# Patient Record
Sex: Male | Born: 1980 | Race: Black or African American | Hispanic: No | Marital: Single | Smoking: Current every day smoker
Health system: Southern US, Community
[De-identification: ages and names within clinical notes are randomized; demographics above are authoritative.]

## PROBLEM LIST (undated history)

## (undated) DIAGNOSIS — A64 Unspecified sexually transmitted disease: Secondary | ICD-10-CM

## (undated) HISTORY — PX: CYST REMOVAL HAND: SHX6279

---

## 2009-06-15 ENCOUNTER — Emergency Department (HOSPITAL_COMMUNITY): Admission: EM | Admit: 2009-06-15 | Discharge: 2009-06-15 | Payer: Self-pay | Admitting: Emergency Medicine

## 2013-01-23 ENCOUNTER — Emergency Department (HOSPITAL_BASED_OUTPATIENT_CLINIC_OR_DEPARTMENT_OTHER)
Admission: EM | Admit: 2013-01-23 | Discharge: 2013-01-23 | Disposition: A | Discharge status: Home / Self Care | Payer: Self-pay | Attending: Emergency Medicine | Admitting: Emergency Medicine

## 2013-01-23 ENCOUNTER — Encounter (HOSPITAL_BASED_OUTPATIENT_CLINIC_OR_DEPARTMENT_OTHER): Payer: Self-pay | Admitting: Emergency Medicine

## 2013-01-23 ENCOUNTER — Emergency Department (HOSPITAL_BASED_OUTPATIENT_CLINIC_OR_DEPARTMENT_OTHER): Payer: Self-pay

## 2013-01-23 ENCOUNTER — Other Ambulatory Visit: Payer: Self-pay

## 2013-01-23 DIAGNOSIS — R079 Chest pain, unspecified: Secondary | ICD-10-CM | POA: Insufficient documentation

## 2013-01-23 DIAGNOSIS — F17200 Nicotine dependence, unspecified, uncomplicated: Secondary | ICD-10-CM | POA: Insufficient documentation

## 2013-01-23 LAB — CBC WITH DIFFERENTIAL/PLATELET
Basophils Absolute: 0 10*3/uL (ref 0.0–0.1)
Basophils Relative: 0 % (ref 0–1)
Eosinophils Relative: 3 % (ref 0–5)
HCT: 40.4 % (ref 39.0–52.0)
Hemoglobin: 13.7 g/dL (ref 13.0–17.0)
Lymphocytes Relative: 65 % — ABNORMAL HIGH (ref 12–46)
Lymphs Abs: 5.9 10*3/uL — ABNORMAL HIGH (ref 0.7–4.0)
MCH: 31.9 pg (ref 26.0–34.0)
MCV: 94.2 fL (ref 78.0–100.0)
Monocytes Absolute: 0.5 10*3/uL (ref 0.1–1.0)
Neutro Abs: 2.3 10*3/uL (ref 1.7–7.7)
RBC: 4.29 MIL/uL (ref 4.22–5.81)
RDW: 13.5 % (ref 11.5–15.5)
WBC: 9.1 10*3/uL (ref 4.0–10.5)

## 2013-01-23 LAB — COMPREHENSIVE METABOLIC PANEL
ALT: 23 U/L (ref 0–53)
AST: 34 U/L (ref 0–37)
CO2: 26 mEq/L (ref 19–32)
Calcium: 9.5 mg/dL (ref 8.4–10.5)
Chloride: 103 mEq/L (ref 96–112)
Creatinine, Ser: 1.1 mg/dL (ref 0.50–1.35)
GFR calc Af Amer: >90 mL/min (ref >90)
GFR calc non Af Amer: 88 mL/min — ABNORMAL LOW (ref >90)
Glucose, Bld: 85 mg/dL (ref 70–99)
Total Bilirubin: 0.3 mg/dL (ref 0.3–1.2)

## 2013-01-23 MED ORDER — KETOROLAC TROMETHAMINE 60 MG/2ML IM SOLN: 60.0000 mg | Freq: Once | INTRAMUSCULAR | Status: DC

## 2013-01-23 MED ORDER — KETOROLAC TROMETHAMINE 30 MG/ML IJ SOLN
30.0000 mg | Freq: Once | INTRAMUSCULAR | Status: AC
  Administered 2013-01-23: 30 mg via INTRAVENOUS
  Filled 2013-01-23: qty 1

## 2013-01-23 NOTE — ED Provider Notes (Signed)
CSN: [id number]     Arrival date & time 01/23/13  2142 History   First MD Initiated Contact with Patient 01/23/13 2203    Scribed for Geoffery Lyons, MD, the patient was seen in room MH03/MH03. This chart was scribed by Lewanda Rife, ED scribe. Patient's care was started at 10:07 PM  Chief Complaint  Patient presents with  . Chest Pain   (Consider location/radiation/quality/duration/timing/severity/associated sxs/prior Treatment) The history is provided by the patient.   HPI Comments: Kanav Kazmierczak is a 32 y.o. male who presents to the Emergency Department complaining of constant moderate chest pain onset yesterday morning. Describes pain as sharp and non-radiating. Reports symptoms are exacerbated by laughing, coughing, and moving neck. Denies associated change in physical activity, shortness of breath, and fever. Denies PMHx of the same.  Reports familial medical hx of CAD and MI: Brother 49 years old.  History reviewed. No pertinent past medical history. Past Surgical History  Procedure Laterality Date  . Cyst removal hand     No family history on file. History  Substance Use Topics  . Smoking status: Current Every Day Smoker -- 0.50 packs/day    Types: Cigarettes  . Smokeless tobacco: Not on file  . Alcohol Use: Yes    Review of Systems  Constitutional: Negative for fever and activity change.  Respiratory: Negative for shortness of breath.   Cardiovascular: Positive for chest pain.  Psychiatric/Behavioral: Negative for confusion.  All other systems reviewed and are negative.   A complete 10 system review of systems was obtained and all systems are negative except as noted in the HPI and PMHx.    Allergies  Review of patient's allergies indicates no known allergies.  Home Medications  No current outpatient prescriptions on file. BP 127/82  Pulse 74  Temp(Src) 98.8 F (37.1 C) (Oral)  Resp 16  Ht 5\' 9"  (1.753 m)  Wt 165 lb (74.844 kg)  BMI 24.36 kg/m2  SpO2  100% Physical Exam  Nursing note and vitals reviewed. Constitutional: He is oriented to person, place, and time. He appears well-developed and well-nourished. No distress.  HENT:  Head: Normocephalic and atraumatic.  Eyes: Conjunctivae and EOM are normal.  Neck: Normal range of motion. Neck supple. No tracheal deviation present.  Cardiovascular: Normal rate and regular rhythm.   No murmur heard. Pulmonary/Chest: Effort normal and breath sounds normal. No respiratory distress. He has no wheezes. He has no rales. He exhibits no tenderness.  Abdominal: Soft. There is no tenderness.  Musculoskeletal: Normal range of motion. He exhibits no edema.  Neurological: He is alert and oriented to person, place, and time.  Skin: Skin is warm and dry.  Psychiatric: He has a normal mood and affect. His behavior is normal.    ED Course  Procedures (including critical care time) DIAGNOSTIC STUDIES: Oxygen Saturation is 100% on room air, normal by my interpretation.    COORDINATION OF CARE:  Nursing notes reviewed. Vital signs reviewed. Initial pt interview and examination performed.   10:09 PM-Discussed work up plan with pt at bedside, which includes Troponin, CBC with diff panel, CMP, and CXR. Pt agrees with plan.   Treatment plan initiated: Medications  ketorolac (TORADOL) 30 MG/ML injection 30 mg (not administered)     Initial diagnostic testing ordered.    Labs Review Labs Reviewed  CBC WITH DIFFERENTIAL  COMPREHENSIVE METABOLIC PANEL  TROPONIN I   Imaging Review No results found.  EKG Interpretation   None       Date: 01/23/2013  Rate: 56  Rhythm: sinus bradycardia  QRS Axis: normal  Intervals: normal  ST/T Wave abnormalities: normal  Conduction Disutrbances:nonspecific intraventricular conduction delay  Narrative Interpretation:   Old EKG Reviewed: none available    MDM  No diagnosis found. Patient is a 32 year old male presents with a 36 hour history of sharp  pain in the right side of his chest. He woke up with this 2 mornings ago and has worsened since that time. Is worse with position and breathing but he denies shortness of breath cough or fever. He denies any recent exertional symptoms. There is no diaphoresis, shortness of breath, or radiation to the arm or jaw. He has no prior cardiac history however he states that an older brother recently had an MI.  Workup reveals an essentially unremarkable EKG and negative troponin despite 36 hours of symptoms. His symptoms are atypical and reproducible with palpation of his chest and with movement of his arms and neck. I strongly doubt that this is cardiac in nature feel as though he is stable for discharge. I think this is most likely musculoskeletal in nature and I feel that he is stable for discharge.   I personally performed the services described in this documentation, which was scribed in my presence. The recorded information has been reviewed and is accurate.      Geoffery Lyons, MD 01/23/13 [phone]

## 2013-01-23 NOTE — ED Notes (Signed)
MD at bedside to discuss results of testing. 

## 2013-01-23 NOTE — ED Notes (Signed)
Pt reports woke last night with pain in right side of chest.  Denies additional symptoms.  Reports worse with positional changes of neck.

## 2014-07-08 IMAGING — CR DG CHEST 2V
2 series · 2 of 2 positions shown · non-contrast
Comparison: None.

CLINICAL DATA: Chest pain

CHEST - 2 VIEW

[w chest pa]
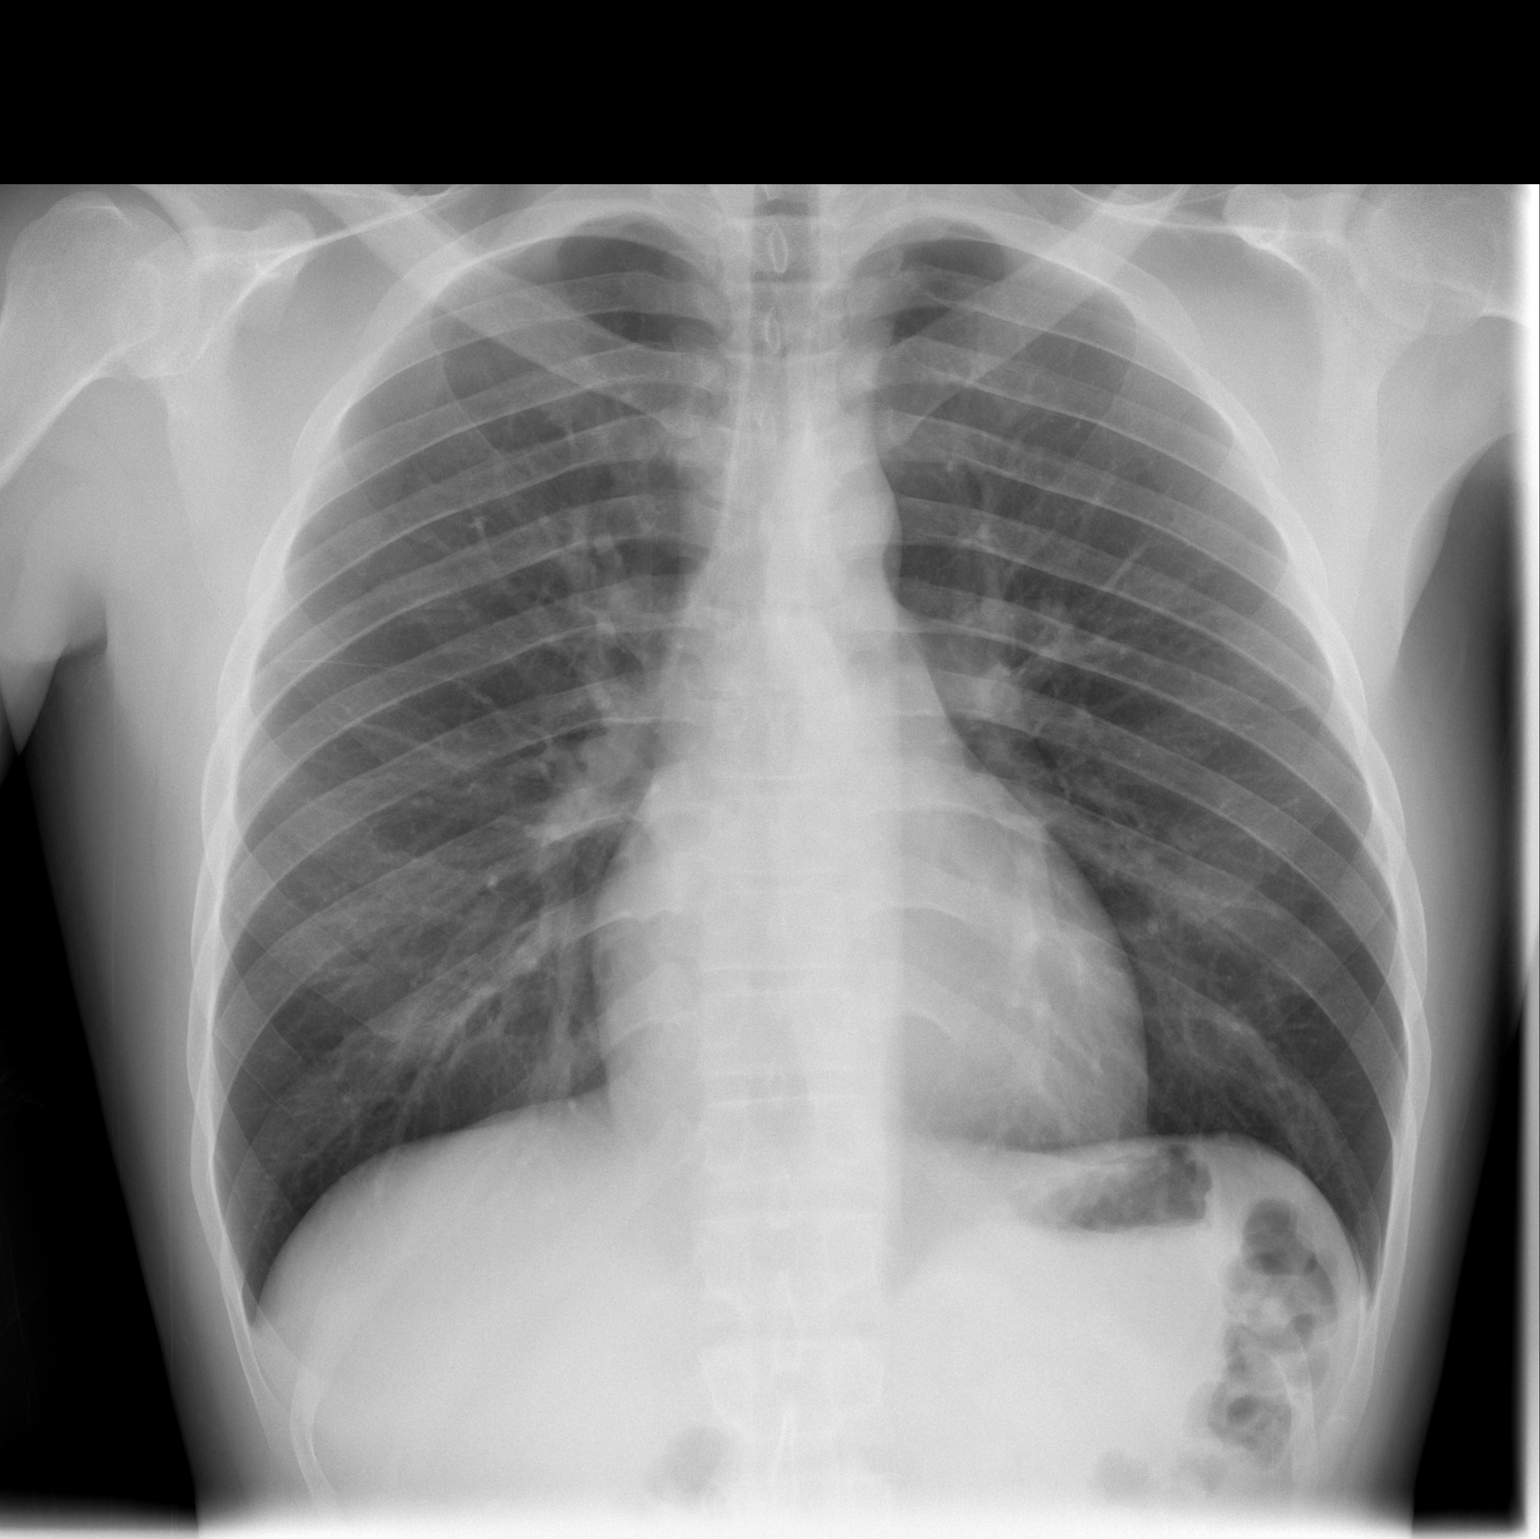

[w chest lat]
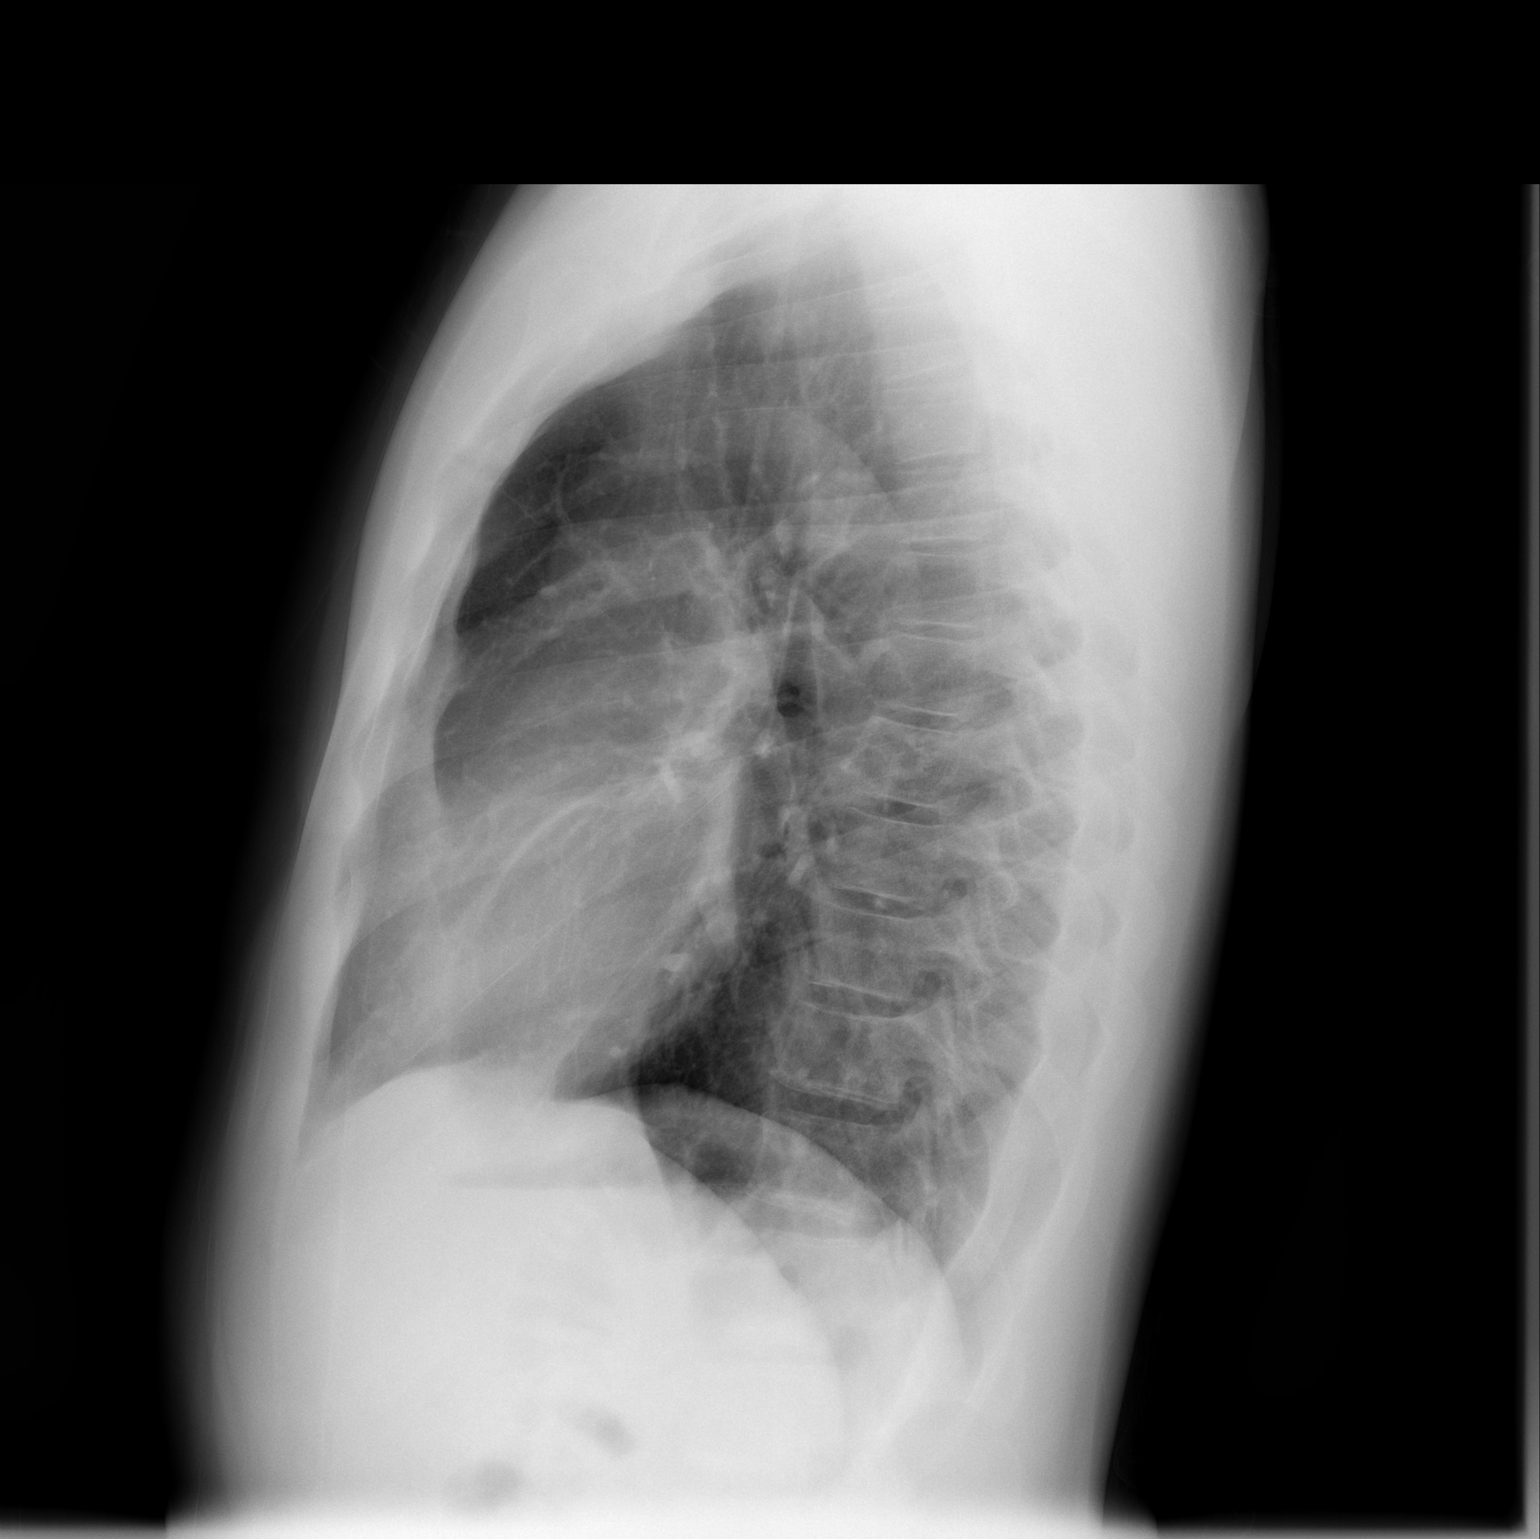

[2 of 2 positions shown; findings below may reference images not displayed]

FINDINGS: Cardiac and mediastinal silhouettes are within normal
limits.

Lungs are well inflated.  No airspace consolidation, effusion, or
pulmonary edema is identified.  There is no pneumothorax.

No acute osseous abnormality.
IMPRESSION: No acute cardiopulmonary process.

## 2017-07-10 ENCOUNTER — Other Ambulatory Visit: Payer: Self-pay

## 2017-07-10 ENCOUNTER — Emergency Department (HOSPITAL_BASED_OUTPATIENT_CLINIC_OR_DEPARTMENT_OTHER)
Admission: EM | Admit: 2017-07-10 | Discharge: 2017-07-10 | Disposition: A | Discharge status: Home / Self Care | Payer: Self-pay | Attending: Emergency Medicine | Admitting: Emergency Medicine

## 2017-07-10 ENCOUNTER — Encounter (HOSPITAL_BASED_OUTPATIENT_CLINIC_OR_DEPARTMENT_OTHER): Payer: Self-pay | Admitting: Emergency Medicine

## 2017-07-10 DIAGNOSIS — H109 Unspecified conjunctivitis: Secondary | ICD-10-CM | POA: Insufficient documentation

## 2017-07-10 DIAGNOSIS — H5711 Ocular pain, right eye: Secondary | ICD-10-CM | POA: Insufficient documentation

## 2017-07-10 DIAGNOSIS — F17210 Nicotine dependence, cigarettes, uncomplicated: Secondary | ICD-10-CM | POA: Insufficient documentation

## 2017-07-10 MED ORDER — FLUORESCEIN SODIUM 1 MG OP STRP
1.0000 | ORAL_STRIP | Freq: Once | OPHTHALMIC | Status: AC
  Administered 2017-07-10: 1 via OPHTHALMIC
  Filled 2017-07-10: qty 1

## 2017-07-10 MED ORDER — ERYTHROMYCIN 5 MG/GM OP OINT: TOPICAL_OINTMENT | OPHTHALMIC | 0 refills | Status: AC

## 2017-07-10 MED ORDER — TETRACAINE HCL 0.5 % OP SOLN
2.0000 [drp] | Freq: Once | OPHTHALMIC | Status: AC
  Administered 2017-07-10: 2 [drp] via OPHTHALMIC
  Filled 2017-07-10: qty 4

## 2017-07-10 MED FILL — ERYTHROMYCIN EYE OINTMENT: 5 | 7 days supply | Qty: 4 | Fill #0

## 2017-07-10 NOTE — ED Provider Notes (Signed)
[facility] EMERGENCY DEPARTMENT Provider Note   CSN: [id number] Arrival date & time: 07/10/17  0941     History   Chief Complaint Chief Complaint  Patient presents with  . Eye Drainage    HPI Devin Mcmillan is a 37 y.o. male.  HPI   Pt is a 37 y/o male with no past medical history who presents to the ED today c/o redness and irritation that began 3 days ago. States pain is burning and rated at 8/10. States he has pain behind the eye which is worse with blinking. Has FB sensation. Has bump to lower eyelid that is also painful. Reports that 2 days ago clear fluid began to drain from the right eye. Reports blurred vision that resolves with blinking. Denies persistent blurred vision, vision changes, or headaches. No fevers, photophobia, headaches. H/o seasonal allergies. No recent cough, congestion, rhinorrhea, sneezing. No recent illnesses. Does not wear contacts. Denies sick contacts with similar sxs.    History reviewed. No pertinent past medical history.  There are no active problems to display for this patient.   Past Surgical History:  Procedure Laterality Date  . CYST REMOVAL HAND         Home Medications    Prior to Admission medications   Medication Sig Start Date End Date Taking? Authorizing Provider  erythromycin ophthalmic ointment Place a 1/2 inch ribbon of ointment into the lower eyelid every 6 hours for 7 days 07/10/17   Kiptyn Rafuse S, PA-C    Family History History reviewed. No pertinent family history.  Social History Social History   Tobacco Use  . Smoking status: Current Every Day Smoker    Packs/day: 0.50    Types: Cigarettes  . Smokeless tobacco: Never Used  Substance Use Topics  . Alcohol use: Yes  . Drug use: No     Allergies   Patient has no known allergies.   Review of Systems Review of Systems  Constitutional: Negative for chills and fever.  HENT: Negative for congestion, ear pain, rhinorrhea and sore throat.     Eyes: Positive for pain, discharge and redness. Negative for photophobia, itching and visual disturbance.  Respiratory: Negative for shortness of breath.   Cardiovascular: Negative for chest pain.  Gastrointestinal: Negative for abdominal pain, nausea and vomiting.  Genitourinary: Negative for dysuria.  Musculoskeletal: Negative for back pain.  Skin: Negative for rash.  Neurological: Negative for dizziness, light-headedness and headaches.  All other systems reviewed and are negative.    Physical Exam Updated Vital Signs BP 121/90 (BP Location: Left Arm)   Pulse 78   Temp 98.4 F (36.9 C) (Oral)   Resp 18   Ht 5\' 9"  (1.753 m)   Wt 74.8 kg (165 lb)   SpO2 98%   BMI 24.37 kg/m   Physical Exam  Constitutional: He is oriented to person, place, and time. He appears well-developed and well-nourished. No distress.  Eyes: Pupils are equal, round, and reactive to light. Conjunctivae and EOM are normal.  No pain with extraocular movements.  No obvious hordeolum or chalazion on exam.  There is some injection to the right conjunctivae.  No subconjunctival hemorrhage.  Fluorescein stain revealed no uptake no evidence of corneal abrasion.  Tonometry within normal limits.  Pressure 19.  No erythema or effusion swelling surrounding the eye.  Photophobia or consensual photophobia.  Neck: Normal range of motion. Neck supple.  Cardiovascular: Normal rate and regular rhythm.  Pulmonary/Chest: Effort normal and breath sounds normal.  Neurological: He  is alert and oriented to person, place, and time. No cranial nerve deficit.  Skin: Skin is warm and dry.     ED Treatments / Results  Labs (all labs ordered are listed, but only abnormal results are displayed) Labs Reviewed - No data to display  EKG None  Radiology No results found.  Procedures Procedures (including critical care time)  Medications Ordered in ED Medications  tetracaine (PONTOCAINE) 0.5 % ophthalmic solution 2 drop (2  drops Right Eye Given 07/10/17 1209)  fluorescein ophthalmic strip 1 strip (1 strip Right Eye Given 07/10/17 1209)     Initial Impression / Assessment and Plan / ED Course  I have reviewed the triage vital signs and the nursing notes.  Pertinent labs & imaging results that were available during my care of the patient were reviewed by me and considered in my medical decision making (see chart for details).    Discussed pt presentation and exam findings with Dr. Deretha EmoryZackowski, who agrees with the plan to discharge the patient without ophthalmology follow-up.  Advised to get erythromycin ointment to prevent secondary bacterial infection.   Final Clinical Impressions(s) / ED Diagnoses   Final diagnoses:  Conjunctivitis of right eye, unspecified conjunctivitis type    Patient presentation consistent with likely viral conjunctivitis.  No purulent discharge, corneal abrasions, entrapment, consensual photophobia, or dendritic staining with fluorescein study.  Presentation non-concerning for iritis, bacterial conjunctivitis, corneal abrasions, or HSV.  Will give erythromycin ointment to prevent secondary bacterial infection.  Personal hygiene and frequent handwashing discussed.  Patient advised to followup in the ED if symptoms persist or worsen in any way including vision change or purulent discharge.  Patient verbalizes understanding and is agreeable with discharge.   ED Discharge Orders        Ordered    erythromycin ophthalmic ointment     07/10/17 [address]1333       Lauriann Milillo S, PA-C 07/10/17 1338    Vanetta MuldersZackowski, Scott, MD 07/11/17 [phone]

## 2017-07-10 NOTE — ED Triage Notes (Signed)
Patient reports right eye drainage and erythema x 2 days. 

## 2017-07-10 NOTE — Discharge Instructions (Signed)
You were given an antibiotic ointment to apply to your eye.  You should apply the ointment into the lower lid every 6 hours for 7 days.  You should follow-up with your primary care doctor within 1 week for reevaluation.  You Were given information on your discharge paperwork to set up primary care if you do not have a primary care doctor.  You should return to the emergency department if you have any fevers, vision changes, worsening pain, eye swelling, persistent symptoms, or any new or worsening symptoms.

## 2017-12-20 ENCOUNTER — Encounter (HOSPITAL_COMMUNITY): Payer: Self-pay | Admitting: Emergency Medicine

## 2017-12-20 ENCOUNTER — Emergency Department (HOSPITAL_COMMUNITY): Payer: Self-pay

## 2017-12-20 ENCOUNTER — Emergency Department (HOSPITAL_COMMUNITY)
Admission: EM | Admit: 2017-12-20 | Discharge: 2017-12-20 | Disposition: A | Discharge status: Home / Self Care | Payer: Self-pay | Attending: Emergency Medicine | Admitting: Emergency Medicine

## 2017-12-20 DIAGNOSIS — F17210 Nicotine dependence, cigarettes, uncomplicated: Secondary | ICD-10-CM | POA: Insufficient documentation

## 2017-12-20 DIAGNOSIS — R072 Precordial pain: Secondary | ICD-10-CM | POA: Insufficient documentation

## 2017-12-20 LAB — I-STAT CHEM 8, ED
BUN: 9 mg/dL (ref 6–20)
CREATININE: 1.3 mg/dL — AB (ref 0.61–1.24)
Calcium, Ion: 1.11 mmol/L — ABNORMAL LOW (ref 1.15–1.40)
Chloride: 104 mmol/L (ref 98–111)
Glucose, Bld: 175 mg/dL — ABNORMAL HIGH (ref 70–99)
HCT: 49 % (ref 39.0–52.0)
Hemoglobin: 16.7 g/dL (ref 13.0–17.0)
Potassium: 3.9 mmol/L (ref 3.5–5.1)
Sodium: 138 mmol/L (ref 135–145)
TCO2: 23 mmol/L (ref 22–32)

## 2017-12-20 LAB — I-STAT TROPONIN, ED
TROPONIN I, POC: 0 ng/mL (ref 0.00–0.08)
Troponin i, poc: 0 ng/mL (ref 0.00–0.08)

## 2017-12-20 LAB — CBC WITH DIFFERENTIAL/PLATELET
Abs Immature Granulocytes: 0 10*3/uL (ref 0.0–0.1)
Basophils Absolute: 0.1 10*3/uL (ref 0.0–0.1)
Basophils Relative: 1 %
Eosinophils Absolute: 0.1 10*3/uL (ref 0.0–0.7)
Eosinophils Relative: 1 %
HEMATOCRIT: 47.4 % (ref 39.0–52.0)
Hemoglobin: 15.7 g/dL (ref 13.0–17.0)
IMMATURE GRANULOCYTES: 0 %
Lymphocytes Relative: 51 %
Lymphs Abs: 5 10*3/uL — ABNORMAL HIGH (ref 0.7–4.0)
MCH: 31.4 pg (ref 26.0–34.0)
MCHC: 33.1 g/dL (ref 30.0–36.0)
MCV: 94.8 fL (ref 78.0–100.0)
Monocytes Absolute: 0.4 10*3/uL (ref 0.1–1.0)
Monocytes Relative: 4 %
NEUTROS PCT: 43 %
Neutro Abs: 4.2 10*3/uL (ref 1.7–7.7)
Platelets: 263 10*3/uL (ref 150–400)
RBC: 5 MIL/uL (ref 4.22–5.81)
RDW: 13.9 % (ref 11.5–15.5)
WBC: 9.8 10*3/uL (ref 4.0–10.5)

## 2017-12-20 MED ORDER — NALOXONE HCL 2 MG/2ML IJ SOSY
PREFILLED_SYRINGE | INTRAMUSCULAR | Status: AC
  Filled 2017-12-20: qty 2

## 2017-12-20 MED ORDER — NALOXONE HCL 2 MG/2ML IJ SOSY
1.0000 mg | PREFILLED_SYRINGE | Freq: Once | INTRAMUSCULAR | Status: AC
  Administered 2017-12-20: 1 mg via INTRAVENOUS

## 2017-12-20 NOTE — ED Notes (Signed)
  Patient is refusing to give urine specimen and is being verbally abusive to staff members.

## 2017-12-20 NOTE — ED Notes (Signed)
   Patient is having difficulty staying awake and talking with EDP and RN.  Patient was sternal rubbed by EDP with minimal to no response.  Patient was given 1mg  narcan IV.  Patient is slurring his speech and seems under the influence of some kind of drug or medication.

## 2017-12-20 NOTE — ED Provider Notes (Signed)
[facility] EMERGENCY DEPARTMENT Provider Note   CSN: [id number] Arrival date & time: 12/20/17  0259     History   Chief Complaint Chief Complaint  Patient presents with  . Chest Pain    HPI Devin Mcmillan is a 37 y.o. male.  The history is provided by a relative and the patient. History limited by: originally to sleepy then uncooperative and will not give a detailed history despite multiple attempts.  Chest Pain   This is a new problem. The current episode started more than 2 days ago. The problem occurs constantly. The problem has not changed since onset.The pain is present in the lateral region. The pain is severe. Quality: achy. The pain does not radiate. Duration of episode(s) is 3 days. Pertinent negatives include no abdominal pain, no fever, no leg pain, no lower extremity edema, no palpitations, no shortness of breath and no weakness. He has tried nothing for the symptoms. The treatment provided no relief. Risk factors include male gender.  Pertinent negatives for past medical history include no Marfan's syndrome and no MI.  Pertinent negatives for family medical history include: no Marfan's syndrome.  Procedure history is negative for cardiac catheterization.  No leg pain no surgeries no long car trips or plane trips.  No steroids, no exertional symptoms.    History reviewed. No pertinent past medical history.  There are no active problems to display for this patient.   Past Surgical History:  Procedure Laterality Date  . CYST REMOVAL HAND          Home Medications    Prior to Admission medications   Medication Sig Start Date End Date Taking? Authorizing Provider  erythromycin ophthalmic ointment Place a 1/2 inch ribbon of ointment into the lower eyelid every 6 hours for 7 days 07/10/17   Couture, Cortni S, PA-C    Family History No family history on file.  Social History Social History   Tobacco Use  . Smoking status: Current Every Day  Smoker    Packs/day: 0.50    Types: Cigarettes  . Smokeless tobacco: Never Used  Substance Use Topics  . Alcohol use: Yes  . Drug use: No     Allergies   Patient has no known allergies.   Review of Systems Review of Systems  Unable to perform ROS: Other (first sleepy or under the influence then refusing and uncooperative )  Constitutional: Negative for fever.  Respiratory: Negative for chest tightness and shortness of breath.   Cardiovascular: Positive for chest pain. Negative for palpitations and leg swelling.  Gastrointestinal: Negative for abdominal pain.  Neurological: Negative for weakness.     Physical Exam Updated Vital Signs BP 99/64   Pulse (!) 58   Temp 98.6 F (37 C) (Oral)   Resp 13   Ht 5\' 9"  (1.753 m)   Wt 74.8 kg   SpO2 97%   BMI 24.37 kg/m   Physical Exam  Constitutional: He appears well-developed and well-nourished. No distress.  Sleeping will not wake to sternal rub, very sleeping   HENT:  Head: Normocephalic and atraumatic.  Right Ear: External ear normal.  Left Ear: External ear normal.  Eyes: Conjunctivae are normal.  Pinpoint pupils  Neck: Normal range of motion. Neck supple.  Cardiovascular: Normal rate, regular rhythm, normal heart sounds and intact distal pulses.  Pulmonary/Chest: Effort normal and breath sounds normal. No stridor. No respiratory distress. He has no wheezes. He has no rales.  Abdominal: Soft. Bowel sounds are normal.  He exhibits no mass. There is no tenderness. There is no rebound and no guarding.  Musculoskeletal: Normal range of motion. He exhibits no tenderness or deformity.  Neurological: He displays normal reflexes.  Skin: Skin is warm and dry. Capillary refill takes less than 2 seconds.  Psychiatric: He has a normal mood and affect.  Nursing note and vitals reviewed.    ED Treatments / Results  Labs (all labs ordered are listed, but only abnormal results are displayed) Results for orders placed or performed  during the hospital encounter of 12/20/17  CBC with Differential/Platelet  Result Value Ref Range   WBC 9.8 4.0 - 10.5 K/uL   RBC 5.00 4.22 - 5.81 MIL/uL   Hemoglobin 15.7 13.0 - 17.0 g/dL   HCT 40.9 81.1 - 91.4 %   MCV 94.8 78.0 - 100.0 fL   MCH 31.4 26.0 - 34.0 pg   MCHC 33.1 30.0 - 36.0 g/dL   RDW 78.2 95.6 - 21.3 %   Platelets 263 150 - 400 K/uL   Neutrophils Relative % 43 %   Neutro Abs 4.2 1.7 - 7.7 K/uL   Lymphocytes Relative 51 %   Lymphs Abs 5.0 (H) 0.7 - 4.0 K/uL   Monocytes Relative 4 %   Monocytes Absolute 0.4 0.1 - 1.0 K/uL   Eosinophils Relative 1 %   Eosinophils Absolute 0.1 0.0 - 0.7 K/uL   Basophils Relative 1 %   Basophils Absolute 0.1 0.0 - 0.1 K/uL   Immature Granulocytes 0 %   Abs Immature Granulocytes 0.0 0.0 - 0.1 K/uL  I-Stat Chem 8, ED  Result Value Ref Range   Sodium 138 135 - 145 mmol/L   Potassium 3.9 3.5 - 5.1 mmol/L   Chloride 104 98 - 111 mmol/L   BUN 9 6 - 20 mg/dL   Creatinine, Ser 0.86 (H) 0.61 - 1.24 mg/dL   Glucose, Bld 578 (H) 70 - 99 mg/dL   Calcium, Ion 4.69 (L) 1.15 - 1.40 mmol/L   TCO2 23 22 - 32 mmol/L   Hemoglobin 16.7 13.0 - 17.0 g/dL   HCT 62.9 52.8 - 41.3 %  I-stat troponin, ED  Result Value Ref Range   Troponin i, poc 0.00 0.00 - 0.08 ng/mL   Comment 3           No results found.  EKG EKG Interpretation  Date/Time:  Friday December 20 2017 03:07:24 EDT Ventricular Rate:  65 PR Interval:  152 QRS Duration: 114 QT Interval:  404 QTC Calculation: 420 R Axis:   78 Text Interpretation:  Normal sinus rhythm with sinus arrhythmia Confirmed by Nicanor Alcon, Emelee Rodocker ([id number]) on 12/20/2017 3:10:31 AM   Radiology No results found.  Procedures Procedures (including critical care time)  Medications Ordered in ED Medications  naloxone (NARCAN) 2 MG/2ML injection (has no administration in time range)  naloxone [facility]) injection 1 mg (1 mg Intravenous Given 12/20/17 0409)     Awoke with narcan, still will not give a detailed  history to EDP despite multiple polite attempts with nurses and family present.   Final Clinical Impressions(s) / ED Diagnoses   HEARt score is 1 very low risk for MACE.  Follow up with your PMD.  Patient would not give urine for UDS.    Return for fevers >100.4 unrelieved by medication, shortness of breath, intractable vomiting, or diarrhea, Inability to tolerate liquids or food, cough, altered mental status or any concerns. No signs of systemic illness or infection. The patient is nontoxic-appearing on exam  and vital signs are within normal limits.   I have reviewed the triage vital signs and the nursing notes. Pertinent labs &imaging results that were available during my care of the patient were reviewed by me and considered in my medical decision making (see chart for details).  After history, exam, and medical workup I feel the patient has been appropriately medically screened and is safe for discharge home. Pertinent diagnoses were discussed with the patient. Patient was given return precautions.     Rosalina Dingwall, MD 12/20/17 [phone]

## 2017-12-20 NOTE — ED Notes (Signed)
Pt made aware that we need a urine sample, but unable to go at this time. 

## 2017-12-20 NOTE — ED Triage Notes (Signed)
Pt reports onset of CP within past hr. Pt states it started on his R chest and moved across to L side with radiation into L neck. Pt appears high... States he smoked weed earlier today.

## 2019-06-04 IMAGING — DX DG CHEST 2V
2 series · 2 of 2 positions shown · non-contrast
Comparison: 01/23/2013

CLINICAL DATA: Right chest pain radiating to the left side and left
neck.

EXAM:
CHEST - 2 VIEW

[chest pa]
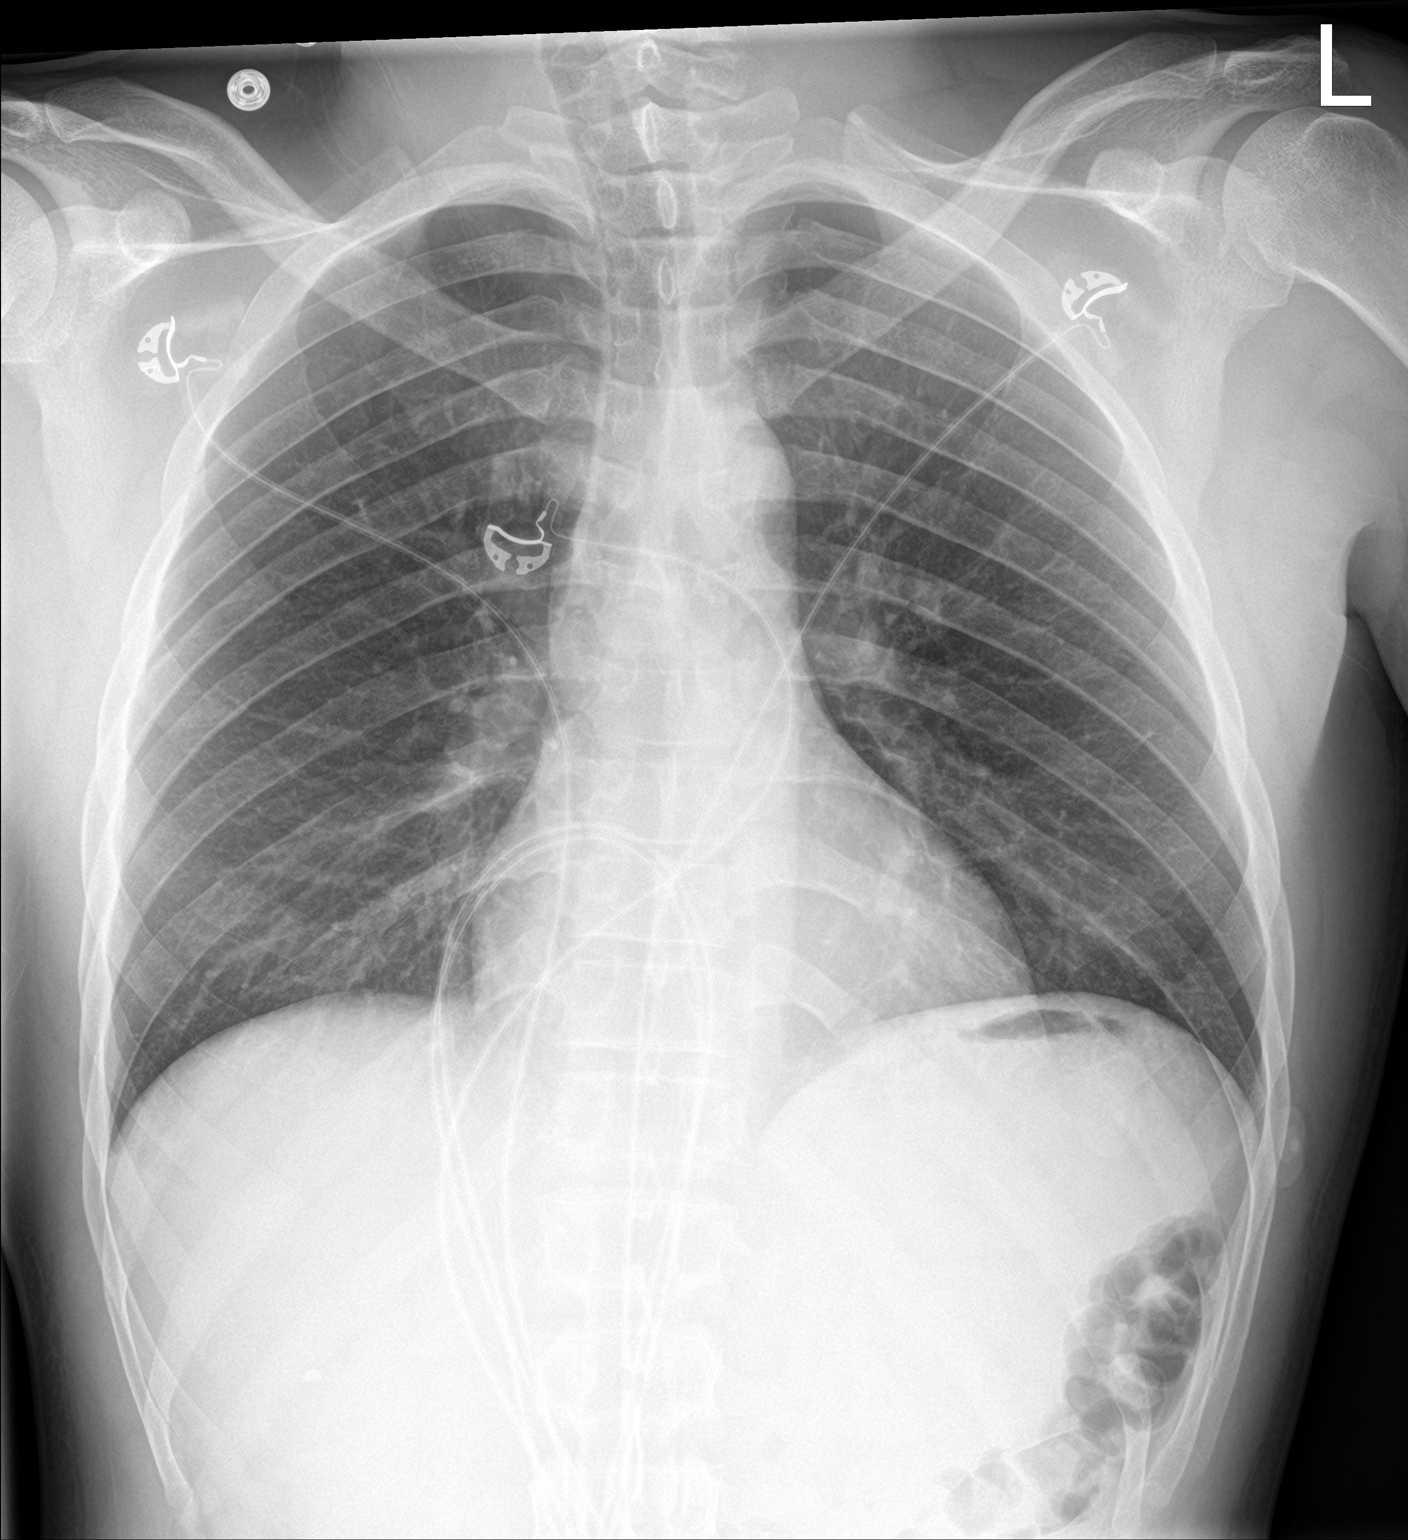

[chest lat]
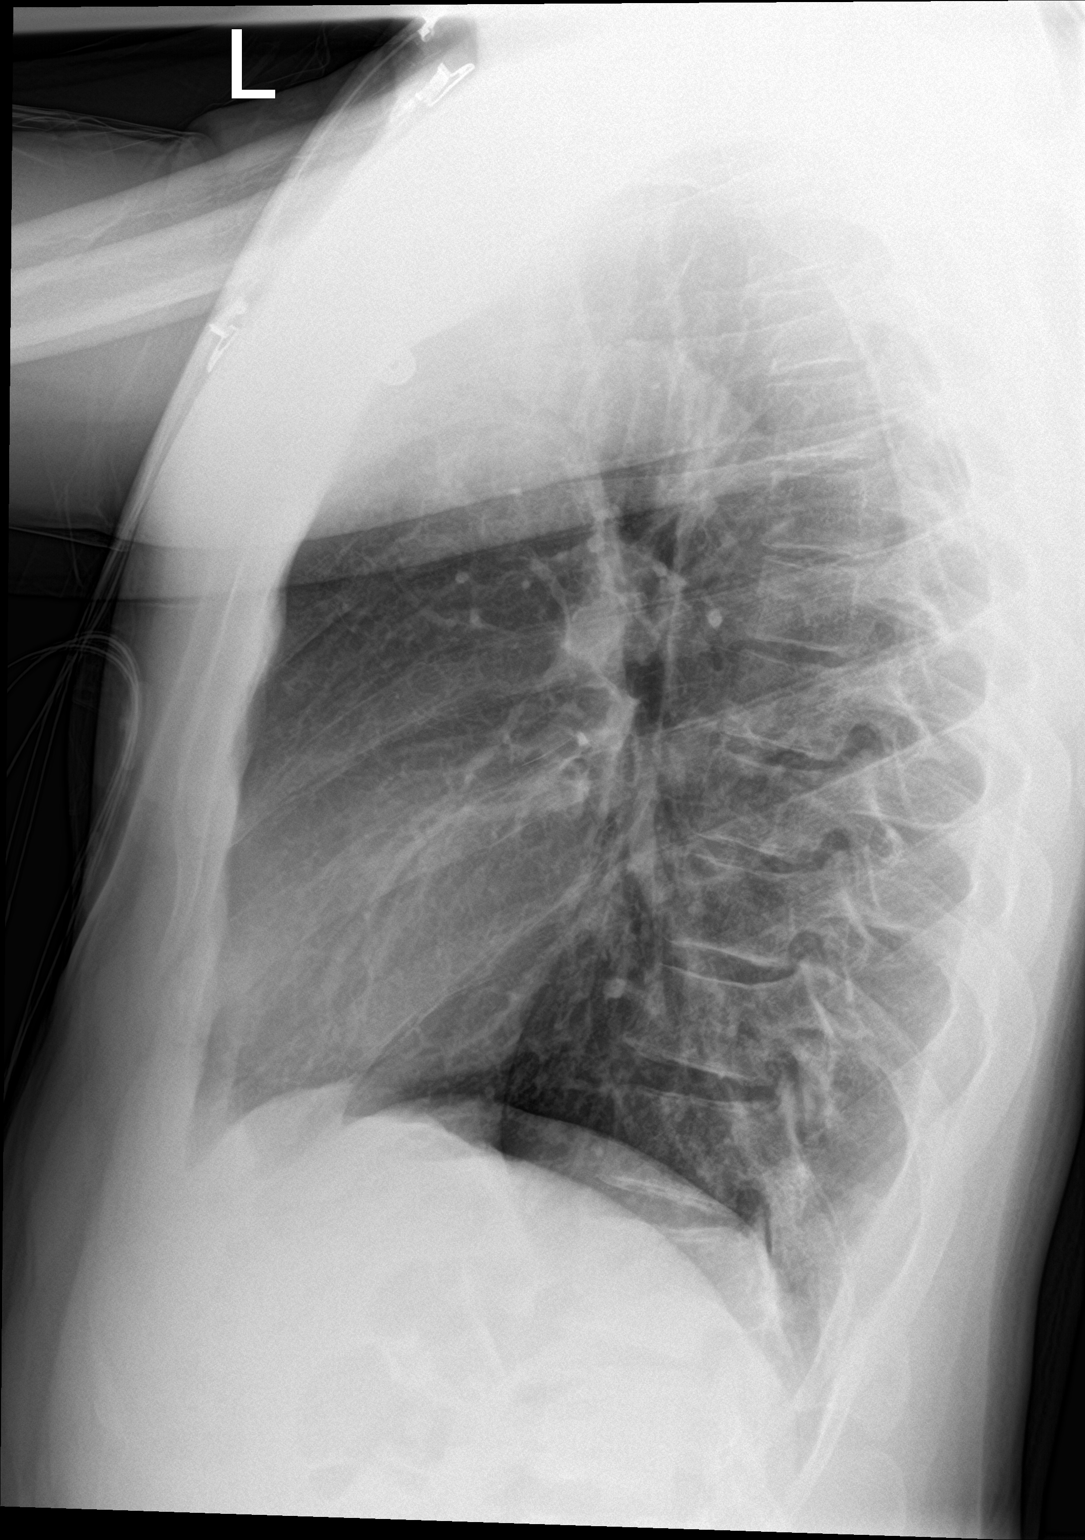

[2 of 2 positions shown; findings below may reference images not displayed]

FINDINGS: The heart size and mediastinal contours are within normal limits.
Both lungs are clear. The visualized skeletal structures are
unremarkable.
IMPRESSION: No active cardiopulmonary disease.

## 2019-06-28 ENCOUNTER — Emergency Department (HOSPITAL_BASED_OUTPATIENT_CLINIC_OR_DEPARTMENT_OTHER)
Admission: EM | Admit: 2019-06-28 | Discharge: 2019-06-28 | Disposition: A | Discharge status: Home / Self Care | Payer: Self-pay | Attending: Emergency Medicine | Admitting: Emergency Medicine

## 2019-06-28 ENCOUNTER — Encounter (HOSPITAL_BASED_OUTPATIENT_CLINIC_OR_DEPARTMENT_OTHER): Payer: Self-pay | Admitting: Emergency Medicine

## 2019-06-28 ENCOUNTER — Other Ambulatory Visit: Payer: Self-pay

## 2019-06-28 DIAGNOSIS — F17210 Nicotine dependence, cigarettes, uncomplicated: Secondary | ICD-10-CM | POA: Insufficient documentation

## 2019-06-28 DIAGNOSIS — I10 Essential (primary) hypertension: Secondary | ICD-10-CM | POA: Insufficient documentation

## 2019-06-28 DIAGNOSIS — N489 Disorder of penis, unspecified: Secondary | ICD-10-CM | POA: Insufficient documentation

## 2019-06-28 HISTORY — DX: Unspecified sexually transmitted disease: A64

## 2019-06-28 LAB — URINALYSIS, ROUTINE W REFLEX MICROSCOPIC
Bilirubin Urine: NEGATIVE
Glucose, UA: NEGATIVE mg/dL
Ketones, ur: NEGATIVE mg/dL
Leukocytes,Ua: NEGATIVE
Nitrite: NEGATIVE
Protein, ur: NEGATIVE mg/dL
Specific Gravity, Urine: >1.03 — ABNORMAL HIGH (ref 1.005–1.030)
pH: 6 (ref 5.0–8.0)

## 2019-06-28 LAB — URINALYSIS, MICROSCOPIC (REFLEX)

## 2019-06-28 LAB — HIV ANTIBODY (ROUTINE TESTING W REFLEX): HIV Screen 4th Generation wRfx: NONREACTIVE

## 2019-06-28 MED ORDER — PENICILLIN G BENZATHINE 1200000 UNIT/2ML IM SUSP
2.4000 10*6.[IU] | Freq: Once | INTRAMUSCULAR | Status: AC
  Administered 2019-06-28: 2.4 10*6.[IU] via INTRAMUSCULAR
  Filled 2019-06-28: qty 4

## 2019-06-28 NOTE — Discharge Instructions (Addendum)
Apply warm compresses to the area of swelling to the left groin 3 times daily for the next few days.  You can take ibuprofen or Tylenol as needed for pain.  Do not try to force any drainage from the area.  Follow up with [facility] Department STD clinic to be screened for HIV in the future and for future STD concerns or screenings. This is the recommendation by the CDC for people with multiple sexual partners or hx of STDs.  You will receive a phone call if any of your cultures are positive.  Do not have sexual intercourse for 7 days after antibiotic treatment.   If your blood pressure (BP) was elevated on multiple readings during this visit above 130 for the top number or above 80 for the bottom number, please have this repeated by your primary care provider within one month. You can also check your blood pressure when you are out at a pharmacy or grocery store. Many have machines that will check your blood pressure.  If your blood pressure remains elevated, please follow-up with your PCP.

## 2019-06-28 NOTE — ED Notes (Signed)
ED Provider at bedside. 

## 2019-06-28 NOTE — ED Provider Notes (Signed)
[place] EMERGENCY DEPARTMENT Provider Note   CSN: [id number] Arrival date & time: 06/28/19  1201     History Chief Complaint  Patient presents with  . Abscess    Devin Mcmillan is a 39 y.o. male presents for evaluation of acute onset, progressively improved area of swelling to the left groin for 4 days.  Reports that he thinks he has a boil to the area.  2 days ago while in the shower was able to express some purulence from it and since then reports it has significantly improved.  Denies any fevers.  Also noticed a "sore" to his penis today and wants to be checked for STDs.  Reports that he has had unprotected sexual intercourse with male partners this year.  No known exposures.  He denies abdominal pain, pain with bowel movements, nausea, vomiting, urinary symptoms, urethral discharge, testicular pain, scrotal swelling, or penile pain.   The history is provided by the patient.       Past Medical History:  Diagnosis Date  . STD (male)     There are no problems to display for this patient.   Past Surgical History:  Procedure Laterality Date  . CYST REMOVAL HAND         No family history on file.  Social History   Tobacco Use  . Smoking status: Current Every Day Smoker    Packs/day: 0.50    Types: Cigarettes  . Smokeless tobacco: Never Used  Substance Use Topics  . Alcohol use: Yes  . Drug use: No    Home Medications Prior to Admission medications   Medication Sig Start Date End Date Taking? Authorizing Provider  erythromycin ophthalmic ointment Place a 1/2 inch ribbon of ointment into the lower eyelid every 6 hours for 7 days Patient not taking: Reported on 12/20/2017 07/10/17   Couture, Cortni S, PA-C    Allergies    Patient has no known allergies.  Review of Systems   Review of Systems  Constitutional: Negative for chills and fever.  Gastrointestinal: Negative for abdominal pain, nausea, rectal pain and vomiting.  Genitourinary: Negative  for discharge, dysuria, flank pain, hematuria, penile pain, penile swelling and scrotal swelling.  Skin:       +abscess?     Physical Exam Updated Vital Signs BP (!) 171/107 (BP Location: Left Arm)   Pulse 75   Temp 98.4 F (36.9 C) (Oral)   Resp 16   Ht 5\' 9"  (1.753 m)   Wt 72.6 kg   SpO2 100%   BMI 23.63 kg/m   Physical Exam Vitals and nursing note reviewed.  Constitutional:      General: He is not in acute distress.    Appearance: He is well-developed.  HENT:     Head: Normocephalic and atraumatic.  Eyes:     General:        Right eye: No discharge.        Left eye: No discharge.     Conjunctiva/sclera: Conjunctivae normal.  Neck:     Vascular: No JVD.     Trachea: No tracheal deviation.  Cardiovascular:     Rate and Rhythm: Normal rate.  Pulmonary:     Effort: Pulmonary effort is normal.  Abdominal:     General: Bowel sounds are normal. There is no distension.     Palpations: Abdomen is soft.     Tenderness: There is no abdominal tenderness. There is no right CVA tenderness, left CVA tenderness, guarding or rebound.  Genitourinary:  Comments: Examination performed in the presence of a chaperone.  No scrotal swelling or testicular pain.  No urethral drainage.  Patient has an extremely small (68mm) shallow ulceration to the dorsum of the penis at the base.  No drainage or tenderness.  Also has a 1 x 2 cm area of induration to the left groin with no fluctuance, erythema, or drainage. Skin:    General: Skin is warm and dry.     Findings: No erythema.  Neurological:     Mental Status: He is alert.  Psychiatric:        Behavior: Behavior normal.     ED Results / Procedures / Treatments   Labs (all labs ordered are listed, but only abnormal results are displayed) Labs Reviewed  URINALYSIS, ROUTINE W REFLEX MICROSCOPIC - Abnormal; Notable for the following components:      Result Value   Specific Gravity, Urine >1.030 (*)    Hgb urine dipstick SMALL (*)     All other components within normal limits  URINALYSIS, MICROSCOPIC (REFLEX) - Abnormal; Notable for the following components:   Bacteria, UA FEW (*)    All other components within normal limits  RPR  HIV ANTIBODY (ROUTINE TESTING W REFLEX)  GC/CHLAMYDIA PROBE AMP ([place]) NOT AT [facility]    EKG None  Radiology No results found.  Procedures Procedures (including critical care time)  Medications Ordered in ED Medications  penicillin g benzathine (BICILLIN LA) 1200000 UNIT/2ML injection 2.4 Million Units (has no administration in time range)    ED Course  I have reviewed the triage vital signs and the nursing notes.  Pertinent labs & imaging results that were available during my care of the patient were reviewed by me and considered in my medical decision making (see chart for details).    MDM Rules/Calculators/A&P                      Patient presenting requesting evaluation for possible STI versus abscess to the groin.  He is afebrile, hypertensive in the ED. vital signs otherwise stable and he is overall quite well-appearing.  We discussed that he should monitor his blood pressures and follow-up with PCP if they remain elevated.  Physical examination overall reassuring.  He has an area of induration to the left groin which does not appear to be amenable to I&D, doubt underlying abscess at this time and I suspect that he was able to drain everything that he could from the area.  Recommended that he apply warm compresses several times a day for the next few days to see if he can bring the area to a head.  He has a very superficial very small lesion to the dorsum of the penis which could possibly indicate a syphilitic chancre but is not concerning for secondary skin infection, Fournier's gangrene.  No evidence of epididymitis, orchitis, prostatitis, or proctitis.  Abdomen is soft, no peritoneal signs.  Will cover with long-acting penicillin in the ED.  Cultures were obtained including  gonorrhea, chlamydia, HIV, and syphilis.  He understands he will receive a phone call if any of his cultures are positive.  Recommend follow-up with [facility] department for any future STD testing or treatment.  Discussed strict ED return precautions. Patient verbalized understanding of and agreement with plan and is safe for discharge home at this time.   Final Clinical Impression(s) / ED Diagnoses Final diagnoses:  Lesion of penis    Rx / DC Orders ED Discharge  Orders    None       Bennye Alm 06/28/19 1423    Little, Ambrose [place], MD 06/28/19 [phone]

## 2019-06-28 NOTE — ED Triage Notes (Signed)
States," I have a boil on my left side and I want to be checked for STD's " Had unprotected sex

## 2019-06-29 LAB — RPR: RPR Ser Ql: NONREACTIVE

## 2019-06-30 LAB — GC/CHLAMYDIA PROBE AMP (~~LOC~~) NOT AT ARMC
Chlamydia: NEGATIVE
Neisseria Gonorrhea: NEGATIVE
